# Patient Record
Sex: Female | Born: 1995 | Race: White | Hispanic: No | Marital: Single | Smoking: Never smoker
Health system: Southern US, Community
[De-identification: ages and names within clinical notes are randomized; demographics above are authoritative.]

## PROBLEM LIST (undated history)

## (undated) DIAGNOSIS — Z789 Other specified health status: Secondary | ICD-10-CM

## (undated) HISTORY — PX: WISDOM TOOTH EXTRACTION: SHX21

## (undated) HISTORY — DX: Other specified health status: Z78.9

---
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
## 2015-01-27 ENCOUNTER — Ambulatory Visit (INDEPENDENT_AMBULATORY_CARE_PROVIDER_SITE_OTHER): Payer: BLUE CROSS/BLUE SHIELD | Admitting: Physician Assistant

## 2015-01-27 VITALS — BP 94/62 | HR 55 | Temp 98.7°F | Resp 14 | Ht 70.5 in | Wt 161.0 lb

## 2015-01-27 DIAGNOSIS — Z0000 Encounter for general adult medical examination without abnormal findings: Secondary | ICD-10-CM | POA: Diagnosis not present

## 2015-01-27 DIAGNOSIS — J029 Acute pharyngitis, unspecified: Secondary | ICD-10-CM

## 2015-01-27 LAB — POCT CBC
Granulocyte percent: 62.3 %G (ref 37–80)
HCT, POC: 36.2 % — AB (ref 37.7–47.9)
HEMOGLOBIN: 12.2 g/dL (ref 12.2–16.2)
LYMPH, POC: 2 (ref 0.6–3.4)
MCH, POC: 30.5 pg (ref 27–31.2)
MCHC: 33.6 g/dL (ref 31.8–35.4)
MCV: 90.8 fL (ref 80–97)
MID (CBC): 0.5 (ref 0–0.9)
MPV: 6.9 fL (ref 0–99.8)
PLATELET COUNT, POC: 316 10*3/uL (ref 142–424)
POC GRANULOCYTE: 4 (ref 2–6.9)
POC LYMPH %: 30 % (ref 10–50)
POC MID %: 7.7 % (ref 0–12)
RBC: 3.99 M/uL — AB (ref 4.04–5.48)
RDW, POC: 14 %
WBC: 6.5 10*3/uL (ref 4.6–10.2)

## 2015-01-27 LAB — POCT RAPID STREP A (OFFICE): RAPID STREP A SCREEN: NEGATIVE

## 2015-01-27 NOTE — Progress Notes (Signed)
01/29/2015 at 7:38 PM  Christy Ramsey / DOB: [birth year] / MRN: [id number]  The patient  does not have a problem list on file.  SUBJECTIVE  Chief complaint: Annual Exam and Nasal Congestion  Patient here today for a annual physical.  Reports she generally feels well and has no chronic health complaints. 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  She is sexually active and is taking birth control without fail. Reports using condoms without fail. Reports binge drinking from time to time.    Complains for sore throat and nasal congestion that started about 2 days ago.  Says her symptoms are improving.  Would like to be ruled out for strep as her sister is being treated for strep right now.    She  has a past medical history of No pertinent past medical history.    Medications reviewed and updated by myself where necessary, and exist elsewhere in the encounter.   Christy Ramsey is allergic to augmentin; biaxin; and penicillins. She  reports that she has never smoked. She does not have any smokeless tobacco history on file. She reports that she does not drink alcohol or use illicit drugs. She  has no sexual activity history on file. The patient  has past surgical history that includes Wisdom tooth extraction.  Her family history includes Atrial fibrillation in her father.  Review of Systems  Constitutional: Negative for fever and chills.  HENT: Positive for sore throat.   Eyes: Negative.   Respiratory: Negative for cough.   Cardiovascular: Negative for chest pain.  Gastrointestinal: Negative for nausea.  Genitourinary: Negative for dysuria and urgency.  Musculoskeletal: Negative for myalgias.  Skin: Negative for itching and rash.  Neurological: Negative for dizziness.  Endo/Heme/Allergies: Negative for polydipsia.  Psychiatric/Behavioral: Negative for depression.    OBJECTIVE  Her  height is 5' 10.5" (1.791 m) and weight is 161 lb  (73.029 kg). Her temperature is 98.7 F (37.1 C). Her blood pressure is 94/62 and her pulse is 55. Her respiration is 14 and oxygen saturation is 98%.  The patient's body mass index is 22.77 kg/(m^2).  Physical Exam  Vitals reviewed. Constitutional: She is oriented to person, place, and time. She appears well-developed and well-nourished. No distress.  HENT:  Head: Normocephalic.  Right Ear: External ear normal.  Left Ear: External ear normal.  Nose: Nose normal.  Mouth/Throat: No oropharyngeal exudate.  Eyes: Right eye exhibits no discharge. Left eye exhibits no discharge.  Neck: Normal range of motion. No tracheal deviation present. No thyromegaly present.  Cardiovascular: Normal rate.   Respiratory: Effort normal and breath sounds normal. No stridor.  GI: Soft. Bowel sounds are normal. She exhibits no distension and no mass. There is no tenderness. There is no rebound and no guarding.  Musculoskeletal: Normal range of motion. She exhibits no edema or tenderness.  Lymphadenopathy:    She has no cervical adenopathy.  Neurological: She is alert and oriented to person, place, and time. She displays normal reflexes. No cranial nerve deficit. She exhibits normal muscle tone. Coordination normal.  Skin: Skin is warm and dry. No rash noted. She is not diaphoretic. No erythema. No pallor.  Psychiatric: She has a normal mood and affect. Her behavior is normal. Judgment and thought content normal.    No results found for this or any previous visit (from the past 24 hour(s)).  ASSESSMENT & PLAN  Enas was seen today for annual exam and  nasal congestion.  Diagnoses and all orders for this visit:  Annual physical exam Orders: -     POCT CBC -     COMPLETE METABOLIC PANEL WITH GFR  Sore throat Orders: -     Culture, Group A Strep -     POCT rapid strep A    The patient was advised to call or come back to clinic if she does not see an improvement in symptoms, or worsens with the  above plan.   Deliah Boston, MHS, PA-C [facility] 01/29/2015 7:38 PM

## 2015-01-28 LAB — COMPLETE METABOLIC PANEL WITH GFR
ALT: 9 U/L (ref 5–32)
AST: 17 U/L (ref 12–32)
Albumin: 4.2 g/dL (ref 3.6–5.1)
Alkaline Phosphatase: 41 U/L — ABNORMAL LOW (ref 47–176)
BUN: 21 mg/dL — AB (ref 7–20)
CHLORIDE: 104 meq/L (ref 98–110)
CO2: 25 meq/L (ref 20–31)
CREATININE: 1.06 mg/dL — AB (ref 0.50–1.00)
Calcium: 9.7 mg/dL (ref 8.9–10.4)
GFR, Est African American: 88 mL/min (ref >=60)
GFR, Est Non African American: 76 mL/min (ref >=60)
GLUCOSE: 99 mg/dL (ref 65–99)
Potassium: 4.1 mEq/L (ref 3.8–5.1)
Sodium: 138 mEq/L (ref 135–146)
TOTAL PROTEIN: 7.3 g/dL (ref 6.3–8.2)
Total Bilirubin: 0.8 mg/dL (ref 0.2–1.1)

## 2015-01-29 ENCOUNTER — Other Ambulatory Visit: Payer: Self-pay | Admitting: Physician Assistant

## 2015-01-29 DIAGNOSIS — R7989 Other specified abnormal findings of blood chemistry: Secondary | ICD-10-CM

## 2015-01-29 LAB — CULTURE, GROUP A STREP: ORGANISM ID, BACTERIA: NORMAL

## 2015-01-30 ENCOUNTER — Telehealth: Payer: Self-pay

## 2015-01-30 NOTE — Telephone Encounter (Signed)
Pt returned our call from earlier °

## 2015-01-30 NOTE — Telephone Encounter (Signed)
See labs 

## 2015-02-11 ENCOUNTER — Ambulatory Visit (INDEPENDENT_AMBULATORY_CARE_PROVIDER_SITE_OTHER): Payer: BLUE CROSS/BLUE SHIELD | Admitting: Family Medicine

## 2015-02-11 VITALS — BP 96/74 | HR 69 | Temp 98.1°F | Resp 14 | Ht 70.5 in | Wt 161.0 lb

## 2015-02-11 DIAGNOSIS — R059 Cough, unspecified: Secondary | ICD-10-CM

## 2015-02-11 DIAGNOSIS — R05 Cough: Secondary | ICD-10-CM

## 2015-02-11 DIAGNOSIS — J019 Acute sinusitis, unspecified: Secondary | ICD-10-CM

## 2015-02-11 MED ORDER — MOMETASONE FUROATE 50 MCG/ACT NA SUSP: NASAL | Status: DC

## 2015-02-11 MED ORDER — BENZONATATE 100 MG PO CAPS: 100.0000 mg | ORAL_CAPSULE | Freq: Three times a day (TID) | ORAL | Status: DC | PRN

## 2015-02-11 MED ORDER — CEFDINIR 300 MG PO CAPS
300.0000 mg | ORAL_CAPSULE | Freq: Two times a day (BID) | ORAL | Status: DC
Start: 2015-02-11 — End: 2017-06-21

## 2015-02-11 NOTE — Patient Instructions (Signed)
Drink plenty of fluids and try to get enough rest  Take the Cedinir [facility]) one pill twice daily for infection. If any evidence of rash discontinue immediately  Take the Nasonex nose spray 2 sprays each nostril twice daily. After about for 5 days you can reduce to once daily  Continue using your cough syrup that you are already have if needed at nighttime  Take the benzonatate cough pills one or 2 pills 3 times daily if needed for daytime cough (not sedating)  Return if worse or not improving

## 2015-02-11 NOTE — Progress Notes (Signed)
  Subjective:  Patient ID: Christy Ramsey, female    DOB: 04-11-1996  Age: 19 y.o. 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 She is on the volleyball team and getting ready to return to school. To exclude she was in here with an upper respiratory infection. Nothing was needed for it, but she then flew up to [place]. She got a lot of discomfort in her years and head pressure. She is persisted with a headache from that. She's been blowing some purulent and sometimes bloody mucus from her nose. She has facial discomfort. She has been coughing and bringing up purulent phlegm. She has not tested to see if she had any fever. She does not smoke. Her only regular medication is birth control.  She has history of allergies to penicillin, Augmentin, and Biaxin. She has tolerated azithromycin and clindamycin in the past   Objective:   Pleasant healthy-appearing young lady. Her TMs are normal. Nose congested and swollen nasal turbinates. Her throat is not erythematous. Neck supple with a left submandibular node. Chest clear to all station. Heart regular without murmur.  Assessment & Plan:   Assessment:  Rhinosinusitis with secondary cough  Plan:  Omnicef. Cautioned regarding any rashes or other problems when on the anabolic. 5% or so cross allergenicity risk explained fluticasone and benzonatate  There are no Patient Instructions on file for this visit.   HOPPER,DAVID, MD 02/11/2015

## 2017-06-16 ENCOUNTER — Telehealth: Payer: Self-pay | Admitting: Internal Medicine

## 2017-06-16 NOTE — Telephone Encounter (Signed)
Only day you would be available to see her in the office would be 06/24/17. You already have 9 on your schedule. Please look over the schedule and tell me which time you think would be best and I will offer that to her and add her on.

## 2017-06-16 NOTE — Telephone Encounter (Signed)
Dottie This patient needs to be seen in my clinic before she returns to college. Okay to work in to see me Thanks

## 2017-06-16 NOTE — Telephone Encounter (Signed)
Dr. Rhea BeltonPyrtle,  Patient mom Aram BeechamCynthia called in stating that you all are friends. She says that patient has been complaining of acid reflux and vomiting and is wanting to know if you would be able to see patient on 12/17,12/21,12/27, or 12/28? Patient will be in town these days. Your next available is in February and next available APP appointment is on 07/06/17.    Thank You Judeth CornfieldStephanie

## 2017-06-17 NOTE — Telephone Encounter (Signed)
I have spoken to Christy Ramsey. She is agreeable to appointment on 06/21/17 at 945 am.

## 2017-06-17 NOTE — Telephone Encounter (Signed)
Christy Ramsey There was a cancellation next week, 06/21/2017 at 9:45 AM Please see if this will work for the patient. It was not a listed date, but explain my limited office time next week and very limited availability due to already being overbooked prior to the holiday

## 2017-06-17 NOTE — Telephone Encounter (Signed)
Left voice mail to call back 

## 2017-06-20 ENCOUNTER — Encounter: Payer: Self-pay | Admitting: *Deleted

## 2017-06-21 ENCOUNTER — Other Ambulatory Visit (INDEPENDENT_AMBULATORY_CARE_PROVIDER_SITE_OTHER): Payer: 59

## 2017-06-21 ENCOUNTER — Ambulatory Visit (INDEPENDENT_AMBULATORY_CARE_PROVIDER_SITE_OTHER): Payer: 59 | Admitting: Internal Medicine

## 2017-06-21 ENCOUNTER — Ambulatory Visit (INDEPENDENT_AMBULATORY_CARE_PROVIDER_SITE_OTHER)
Admission: RE | Admit: 2017-06-21 | Discharge: 2017-06-21 | Disposition: A | Discharge status: Home / Self Care | Payer: 59 | Source: Ambulatory Visit | Attending: Internal Medicine | Admitting: Internal Medicine

## 2017-06-21 ENCOUNTER — Encounter: Payer: Self-pay | Admitting: Internal Medicine

## 2017-06-21 ENCOUNTER — Encounter (INDEPENDENT_AMBULATORY_CARE_PROVIDER_SITE_OTHER): Payer: Self-pay

## 2017-06-21 VITALS — BP 108/64 | HR 66 | Ht 72.0 in | Wt 160.0 lb

## 2017-06-21 DIAGNOSIS — R079 Chest pain, unspecified: Secondary | ICD-10-CM | POA: Diagnosis not present

## 2017-06-21 DIAGNOSIS — K219 Gastro-esophageal reflux disease without esophagitis: Secondary | ICD-10-CM

## 2017-06-21 DIAGNOSIS — R05 Cough: Secondary | ICD-10-CM

## 2017-06-21 DIAGNOSIS — R059 Cough, unspecified: Secondary | ICD-10-CM

## 2017-06-21 DIAGNOSIS — J069 Acute upper respiratory infection, unspecified: Secondary | ICD-10-CM

## 2017-06-21 LAB — COMPREHENSIVE METABOLIC PANEL
ALK PHOS: 54 U/L (ref 39–117)
ALT: 9 U/L (ref 0–35)
AST: 14 U/L (ref 0–37)
Albumin: 4.1 g/dL (ref 3.5–5.2)
BUN: 15 mg/dL (ref 6–23)
CHLORIDE: 102 meq/L (ref 96–112)
CO2: 27 mEq/L (ref 19–32)
Calcium: 9.6 mg/dL (ref 8.4–10.5)
Creatinine, Ser: 0.86 mg/dL (ref 0.40–1.20)
GFR: 87.59 mL/min (ref >60.00)
Glucose, Bld: 83 mg/dL (ref 70–99)
POTASSIUM: 4 meq/L (ref 3.5–5.1)
SODIUM: 138 meq/L (ref 135–145)
TOTAL PROTEIN: 7.6 g/dL (ref 6.0–8.3)
Total Bilirubin: 0.7 mg/dL (ref 0.2–1.2)

## 2017-06-21 LAB — IGA: IGA: 135 mg/dL (ref 68–378)

## 2017-06-21 LAB — CBC WITH DIFFERENTIAL/PLATELET
BASOS PCT: 1 % (ref 0.0–3.0)
Basophils Absolute: 0.1 10*3/uL (ref 0.0–0.1)
EOS ABS: 0.4 10*3/uL (ref 0.0–0.7)
EOS PCT: 6 % — AB (ref 0.0–5.0)
HCT: 39.3 % (ref 36.0–46.0)
Hemoglobin: 13 g/dL (ref 12.0–15.0)
LYMPHS ABS: 1.8 10*3/uL (ref 0.7–4.0)
Lymphocytes Relative: 25.6 % (ref 12.0–46.0)
MCHC: 33 g/dL (ref 30.0–36.0)
MCV: 94.1 fl (ref 78.0–100.0)
MONO ABS: 0.5 10*3/uL (ref 0.1–1.0)
Monocytes Relative: 7.7 % (ref 3.0–12.0)
NEUTROS ABS: 4.2 10*3/uL (ref 1.4–7.7)
Neutrophils Relative %: 59.7 % (ref 43.0–77.0)
PLATELETS: 387 10*3/uL (ref 150.0–400.0)
RBC: 4.18 Mil/uL (ref 3.87–5.11)
RDW: 13 % (ref 11.5–15.5)
WBC: 7 10*3/uL (ref 4.0–10.5)

## 2017-06-21 LAB — H. PYLORI ANTIBODY, IGG: H Pylori IgG: NEGATIVE

## 2017-06-21 MED ORDER — PANTOPRAZOLE SODIUM 40 MG PO TBEC: 40.0000 mg | DELAYED_RELEASE_TABLET | Freq: Every day | ORAL | 1 refills | Status: DC

## 2017-06-21 MED ORDER — AZITHROMYCIN 250 MG PO TABS: ORAL_TABLET | ORAL | 0 refills | Status: DC

## 2017-06-21 MED ORDER — PANTOPRAZOLE SODIUM 40 MG PO TBEC: 40.0000 mg | DELAYED_RELEASE_TABLET | Freq: Every day | ORAL | 1 refills | Status: AC

## 2017-06-21 NOTE — Patient Instructions (Signed)
Your physician has requested that you go to the basement for the following lab work before leaving today: CBC, CMP, Celiac, H Pylori  We have sent the following medications to your pharmacy for you to pick up at your convenience: Protonix 40 mg once daily Zpak  Discontinue ranitidine.  Call our office or send a note through "MyChart" in 1 month to let us know how the Protonix is working for you.  Your provider has requested that you have an chest x ray before leaving today. Please go to the basement floor to our Radiology department for the test.  If you are age 22 or older, your body mass index should be between 23-30. Your Body mass index is 21.7 kg/m. If this is out of the aforementioned range listed, please consider follow up with your Primary Care Provider.  If you are age 22 or younger, your body mass index should be between 19-25. Your Body mass index is 21.7 kg/m. If this is out of the aformentioned range listed, please consider follow up with your Primary Care Provider.

## 2017-06-21 NOTE — Progress Notes (Signed)
Patient ID: Christy Ramsey, female   DOB: 1995/12/19, 21 y.o.   MRN: [id number] HPI: Christy Ramsey is a 21 year old female with no significant past medical history who was seen to evaluate reflux symptoms.  She is here alone today.  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  She just completed a varsity volleyball and [place] to [place] graduation for investment banking.  She reports that she developed issues with acid reflux approximately 2 summers ago when she was living in [place].  She states at that point she was living a different lifestyle and that she was often drinking alcohol at night, eating late at night and staying up late.  During this time she developed significant heartburn and regurgitation.  She reports also feeling at times a tightness in her chest and the feeling that she needs to belch but often cannot.  During periods when this is most severe she reveals decreased appetite which gets worse with food.  She will also avoid eating at times to avoid the reflux symptoms.  She has not had dysphagia or odynophagia.  Occasionally she will feel an epigastric uncomfortableness and bloating feeling.  No hoarseness or throat clearing symptom.  Symptom definitively worsens with stress particularly during exams.  Alcohol also seems to worsen her symptoms.  She was frequently drinking coffee which she felt may have been a contributor.  Recently she has stopped coffee and drinking more hot tea.  Symptoms were still persistent intermittently during volleyball season when she was not drinking alcohol and had better sleeping and exercise habits.  Separate from this she has had about 10 days of a URI.  Significant cough now productive of greenish thick sputum.  She feels an uncomfortable chest pressure and pain without dyspnea.  No fevers.  The symptoms seem to have worsened her reflux.  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  They also put her on ranitidine which she has been using 150 mg twice daily for about a week.  This has helped her reflux somewhat.  She did try about 20 days of over-the-counter omeprazole 20 mg daily about 6 or 8 months ago.  This did not seem to help her reflux symptoms at all.  She takes birth control pill and has a very light menstrual cycle monthly.  Past Medical History:  Diagnosis Date  . No pertinent past medical history     Past Surgical History:  Procedure Laterality Date  . WISDOM TOOTH EXTRACTION      Outpatient Medications Prior to Visit  Medication Sig Dispense Refill  . benzonatate (TESSALON) 100 MG capsule Take 1-2 capsules (100-200 mg total) by mouth 3 (three) times daily as needed. 30 capsule 0  . norethindrone-ethinyl estradiol (JUNEL FE,GILDESS FE,LOESTRIN FE) 1-20 MG-MCG tablet Take 1 tablet by mouth daily.    . cefdinir (OMNICEF) 300 MG capsule Take 1 capsule (300 mg total) by mouth 2 (two) times daily. 20 capsule 0  . mometasone (NASONEX) 50 MCG/ACT nasal spray Use 2 sprays each nostril twice daily 17 g 0   No facility-administered medications prior to visit.     Allergies  Allergen Reactions  . Augmentin [Amoxicillin-Pot Clavulanate]   . Biaxin [Clarithromycin] Hives  . Penicillins Hives    Family History  Problem Relation Age of Onset  . Atrial fibrillation Father  Social History   Tobacco Use  . Smoking status: Never Smoker  . Smokeless tobacco: Never Used  Substance Use Topics  . Alcohol use: Yes  . Drug use: No    ROS: As per history of present illness, otherwise negative  BP 108/64   Pulse 66   Ht 6' (1.829 m)   Wt 160 lb (72.6 kg)   LMP 06/21/2017   BMI 21.70 kg/m  Constitutional: Well-developed and well-nourished. No distress. HEENT: Normocephalic and atraumatic. Oropharynx is clear and moist. Conjunctivae are normal.  No scleral  icterus. Neck: Neck supple. Trachea midline. Cardiovascular: Normal rate, regular rhythm and intact distal pulses. No M/R/G Pulmonary/chest: Effort normal and breath sounds normal. No wheezing, rales or rhonchi. Abdominal: Soft, nontender, nondistended. Bowel sounds active throughout. There are no masses palpable. No hepatosplenomegaly. Extremities: no clubbing, cyanosis, or edema Neurological: Alert and oriented to person place and time. Skin: Skin is warm and dry.  Psychiatric: Normal mood and affect. Behavior is normal.   ASSESSMENT/PLAN: 21 year old female with no significant past medical history who was seen to evaluate reflux symptoms.  1.  GERD --she has classic reflux symptoms and meets diagnostic criteria.  We discussed GERD hygiene and lifestyle and dietary modifications which can improve overall GERD symptoms.  These include alcohol avoidance, avoiding eating late at night, avoid lying down within 2 hours of eating.  Avoiding caffeine and other trigger foods. --Begin a trial of pantoprazole 40 mg once daily; if no improvement consider EGD --Discontinue ranitidine --Check CBC, CMP, celiac disease and (ideally will check H. pylori stool antigen but she is going out of town over the holidays.  If no response to PPI we can check H. pylori stool antigen off PPI) --She is asked to call or email within 1 month to let me know how she responds to pantoprazole therapy.  2.  URI with productive cough --10 days now.  Meets criteria for antibiotic therapy.  PA and lateral chest x-ray today.  CBC and CMP as above.  Z-Pak.  Can continue Tessalon as needed cough.

## 2017-06-22 LAB — TISSUE TRANSGLUTAMINASE, IGA: (TTG) AB, IGA: 1 U/mL

## 2017-06-29 ENCOUNTER — Telehealth: Payer: Self-pay | Admitting: Internal Medicine

## 2017-06-29 NOTE — Telephone Encounter (Signed)
The pt is calling about a cough and headache.  I advised her to call her PCP regarding this and she agreed.

## 2017-06-29 NOTE — Telephone Encounter (Signed)
Called patient back, unable to leave a voice message. Will try her again later.

## 2017-07-19 ENCOUNTER — Telehealth: Payer: Self-pay | Admitting: Internal Medicine

## 2017-07-19 ENCOUNTER — Other Ambulatory Visit: Payer: Self-pay

## 2017-07-19 MED ORDER — PANTOPRAZOLE SODIUM 40 MG PO TBEC: 40.0000 mg | DELAYED_RELEASE_TABLET | Freq: Two times a day (BID) | ORAL | 3 refills | Status: DC

## 2017-07-19 NOTE — Telephone Encounter (Signed)
Patient finished 30 days of protonix yesterday. States she is feeling much better but is still having some episodes of belching, abdominal pain but generally feels much better. Pt would like to continue taking the medication but wonders if she may need a higher dose since she still does have some symptoms. Reports she is under less stress now also. Please advise.

## 2017-07-19 NOTE — Telephone Encounter (Signed)
Glad to hear she is feeling better Okay to continue pantoprazole 40 mg once daily, and okay to use 2nd daily dose (30 min before last meal of the day) when need She should call for persistent symptoms

## 2017-07-19 NOTE — Addendum Note (Signed)
Addended by: Selinda MichaelsHUNT, Kem Parcher R on: 07/19/2017 04:22 PM   Modules accepted: Orders

## 2017-07-19 NOTE — Telephone Encounter (Signed)
Pt aware and script sent to pharmacy. 

## 2017-08-22 ENCOUNTER — Ambulatory Visit: Payer: 59

## 2017-08-22 ENCOUNTER — Encounter: Payer: Self-pay | Admitting: Podiatry

## 2017-08-22 ENCOUNTER — Other Ambulatory Visit: Payer: Self-pay | Admitting: Podiatry

## 2017-08-22 ENCOUNTER — Ambulatory Visit (INDEPENDENT_AMBULATORY_CARE_PROVIDER_SITE_OTHER): Payer: 59 | Admitting: Podiatry

## 2017-08-22 ENCOUNTER — Ambulatory Visit (INDEPENDENT_AMBULATORY_CARE_PROVIDER_SITE_OTHER): Payer: 59

## 2017-08-22 DIAGNOSIS — B351 Tinea unguium: Secondary | ICD-10-CM | POA: Diagnosis not present

## 2017-08-22 DIAGNOSIS — M779 Enthesopathy, unspecified: Secondary | ICD-10-CM

## 2017-08-22 DIAGNOSIS — L84 Corns and callosities: Secondary | ICD-10-CM | POA: Diagnosis not present

## 2017-08-22 DIAGNOSIS — M7750 Other enthesopathy of unspecified foot: Secondary | ICD-10-CM | POA: Diagnosis not present

## 2017-08-22 DIAGNOSIS — M79676 Pain in unspecified toe(s): Secondary | ICD-10-CM | POA: Diagnosis not present

## 2017-08-22 DIAGNOSIS — L603 Nail dystrophy: Secondary | ICD-10-CM | POA: Diagnosis not present

## 2017-08-22 DIAGNOSIS — R52 Pain, unspecified: Secondary | ICD-10-CM

## 2017-08-24 NOTE — Progress Notes (Signed)
   Subjective: 22 year old female presenting today as a new patient with a chief complaint of painful blisters to the 2nd and 5th toes of the right foot that have been present for the past year. She reports pain to the nail of the second toe of the right foot. She also reports pain to the 2nd and 3rd toes of the left foot. She reports intermittent swelling to bilateral ankles and numbness to the toes of the right foot. She states she injured her shins about two years ago and had bruising on her feet for the entire next year. She has not done anything to treat her symptoms. She states she is a runner which exacerbates her symptoms. Patient is here for further evaluation and treatment.   Past Medical History:  Diagnosis Date  . No pertinent past medical history     Objective: Physical Exam General: The patient is alert and oriented x3 in no acute distress.  Dermatology: Hyperkeratotic, discolored, thickened, onychodystrophy of nails 2-3 noted bilaterally. Hyperkeratotic lesions present on the 2nd toes bilaterally. Pain on palpation with a central nucleated core noted. Skin is warm, dry and supple bilateral lower extremities. Negative for open lesions or macerations.  Vascular: Palpable pedal pulses bilaterally. No edema or erythema noted. Capillary refill within normal limits.  Neurological: Epicritic and protective threshold grossly intact bilaterally.   Musculoskeletal Exam: Range of motion within normal limits to all pedal and ankle joints bilateral. Muscle strength 5/5 in all groups bilateral.   Radiographic Exam:  Normal osseous mineralization. Joint spaces preserved. No fracture/dislocation/boney destruction.     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 X-Rays reviewed.  #2 Mechanical debridement of nails 2-3 bilaterally performed using a nail nipper. Filed with dremel without incident.  #3 Excisional  debridement of keratoic lesion using a chisel blade was performed without incident.  #4 Explained that her problems are likely from running.  #5 Return to clinic as needed.     Felecia ShellingBrent M. Henry Utsey, DPM [facility]  Dr. Felecia ShellingBrent M. Luismanuel Corman, DPM    [address]                                        [place], [place] [address]                Office [phone]  Fax [phone]

## 2018-07-11 IMAGING — DX DG CHEST 2V
2 series · 2 of 2 positions shown · non-contrast
Comparison: None.

CLINICAL DATA: Cough for 10 days.

EXAM:
CHEST  2 VIEW

[chest lat]
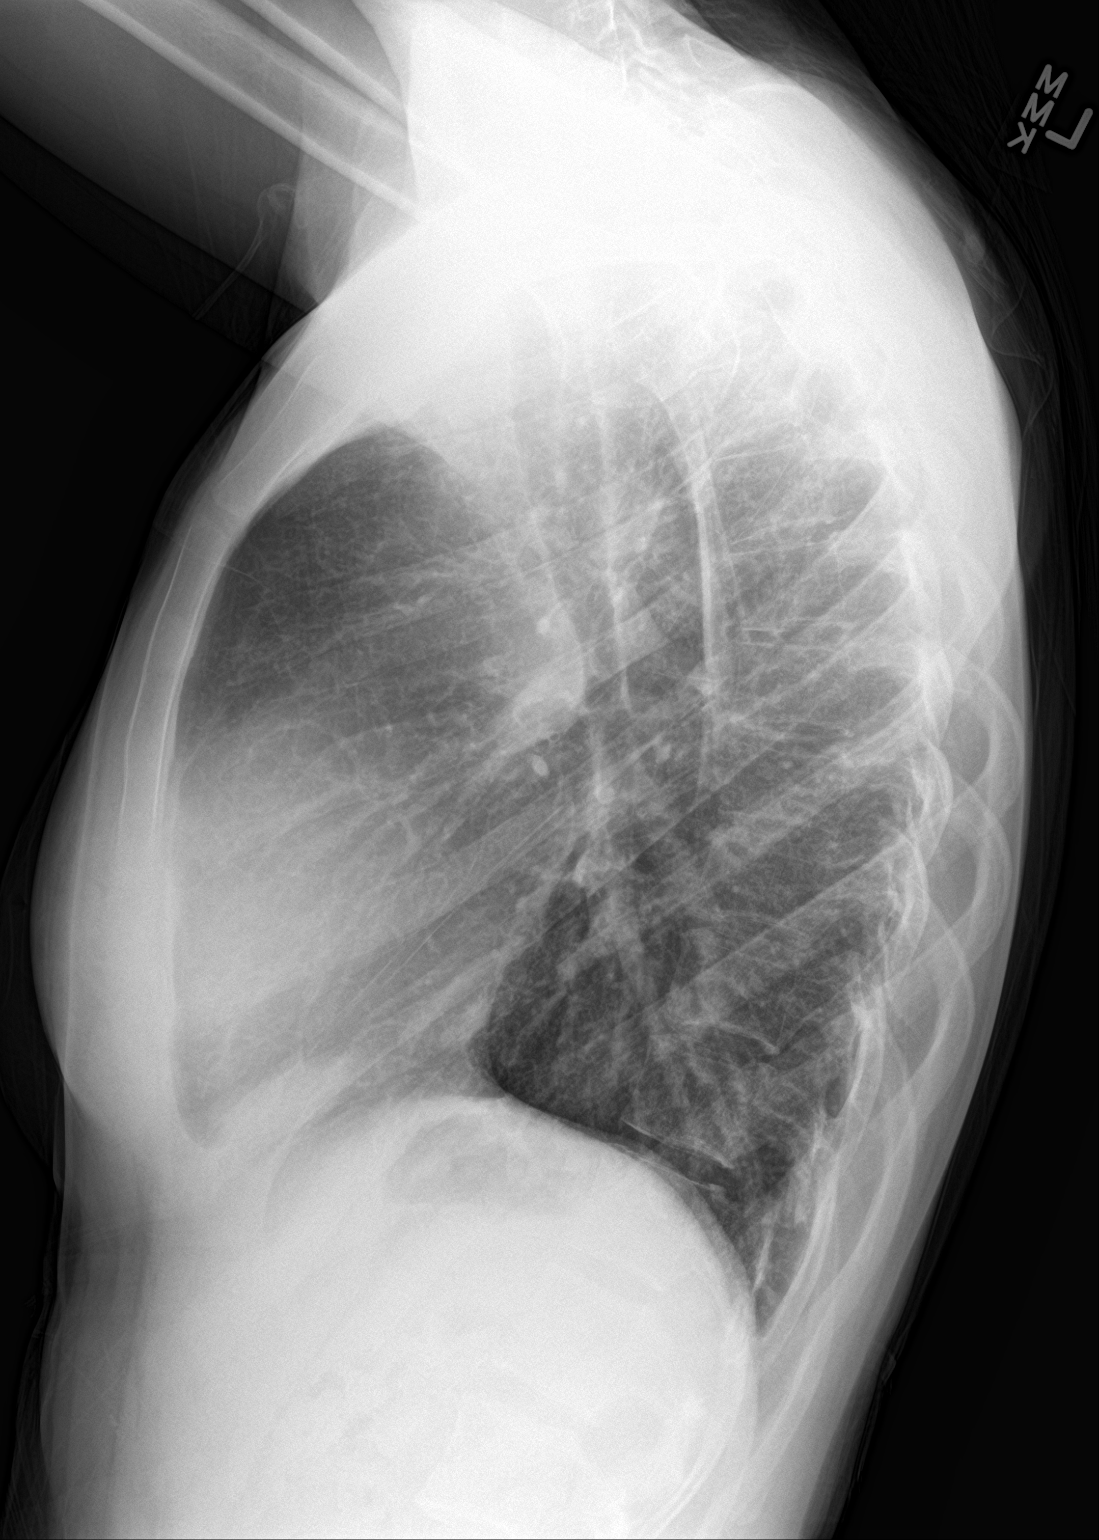

[chest pa]
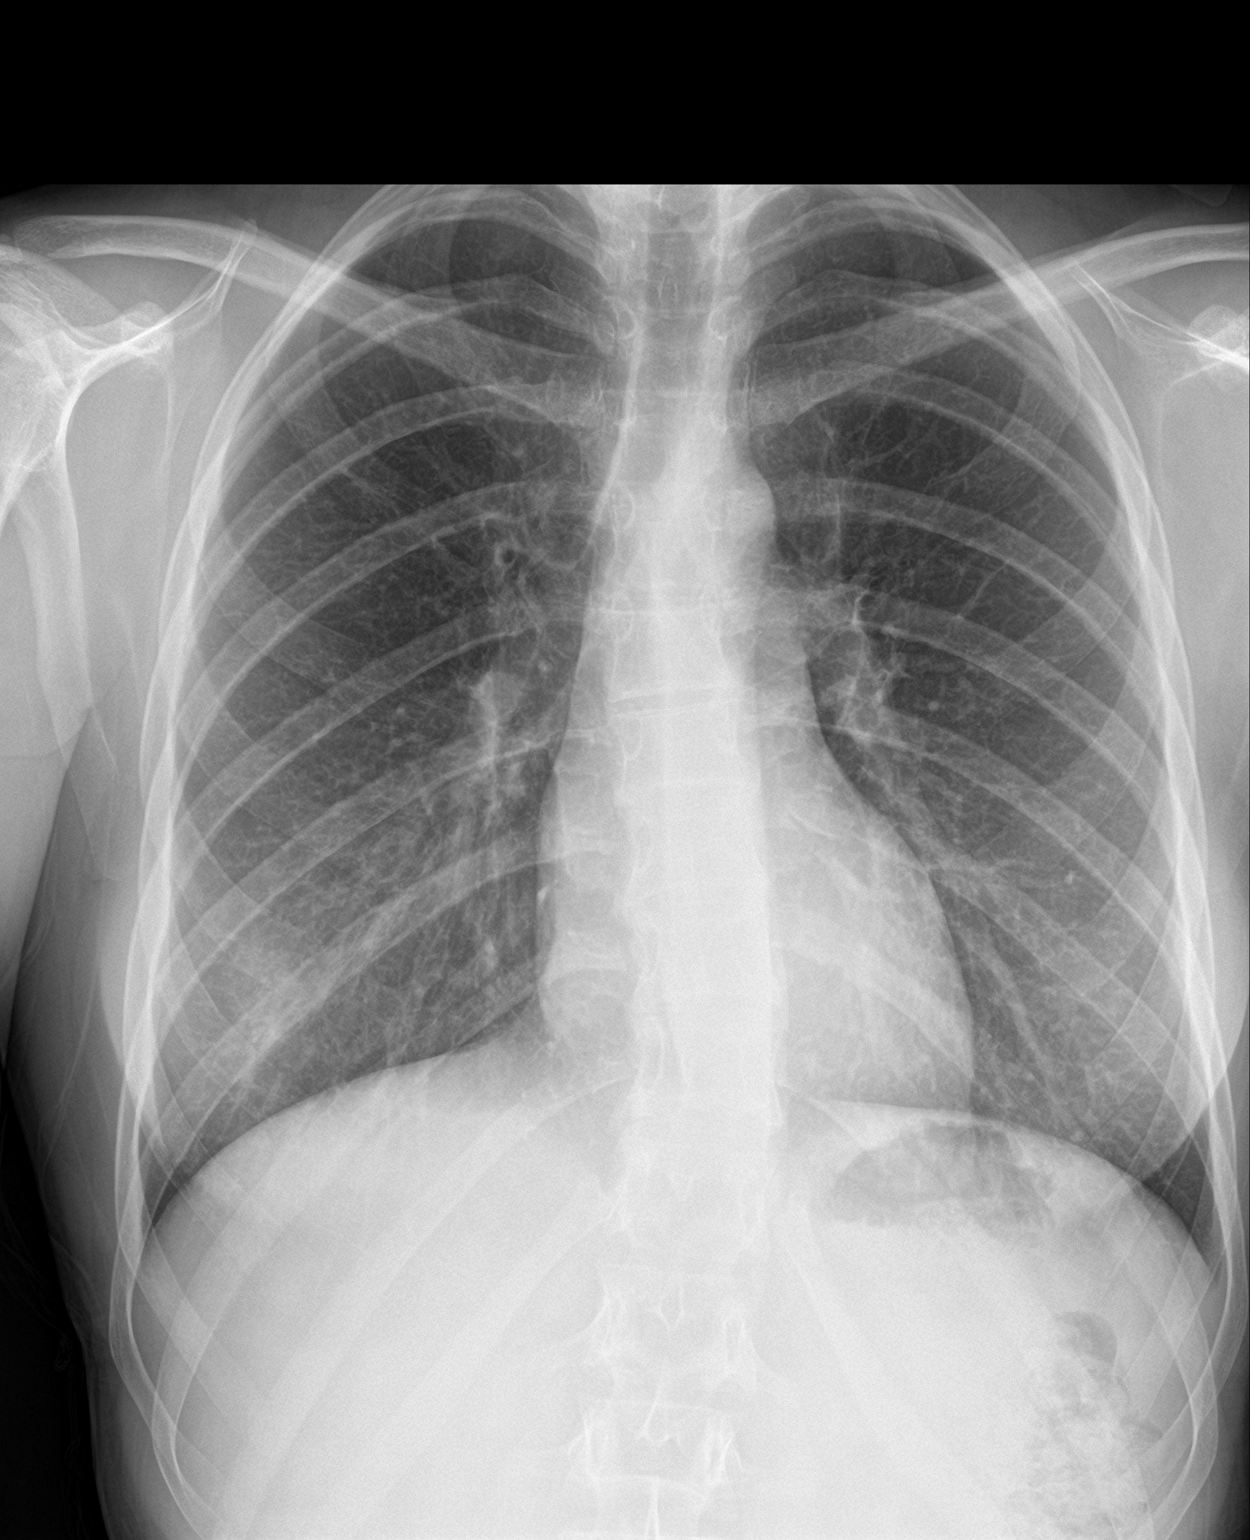

[2 of 2 positions shown; findings below may reference images not displayed]

FINDINGS: Hazy airspace disease is seen in the right lower lung zone appears
to be in the right middle lobe on the lateral view. The left lung is
clear. No pneumothorax or pleural effusion. Heart size is normal.
Mild thoracolumbar scoliosis is noted.
IMPRESSION: Hazy airspace disease in the right middle lobe worrisome for
pneumonia.

## 2018-08-23 ENCOUNTER — Telehealth: Payer: Self-pay | Admitting: Internal Medicine

## 2018-08-23 ENCOUNTER — Other Ambulatory Visit: Payer: Self-pay | Admitting: Internal Medicine

## 2018-08-23 MED ORDER — PANTOPRAZOLE SODIUM 40 MG PO TBEC: 40.0000 mg | DELAYED_RELEASE_TABLET | Freq: Two times a day (BID) | ORAL | 0 refills | Status: DC

## 2018-08-23 NOTE — Telephone Encounter (Signed)
Patient advised we will give her 1 month of pantoprazole so she can get in to see another GI provider in [place]. However, we cannot continue refilling her pantoprazole without office visit here. She verbalizes understanding and states she will get further refills elsewhere.

## 2018-09-22 ENCOUNTER — Other Ambulatory Visit: Payer: Self-pay | Admitting: Internal Medicine

## 2019-08-06 ENCOUNTER — Other Ambulatory Visit: Payer: 59 | Attending: Internal Medicine

## 2019-08-06 DIAGNOSIS — Z20822 Contact with and (suspected) exposure to covid-19: Secondary | ICD-10-CM

## 2019-08-07 LAB — NOVEL CORONAVIRUS, NAA: SARS-CoV-2, NAA: NOT DETECTED

## 2019-09-10 ENCOUNTER — Ambulatory Visit: Payer: 59 | Attending: Internal Medicine

## 2019-09-10 DIAGNOSIS — Z20822 Contact with and (suspected) exposure to covid-19: Secondary | ICD-10-CM

## 2019-09-11 LAB — NOVEL CORONAVIRUS, NAA: SARS-CoV-2, NAA: NOT DETECTED
